# Patient Record
Sex: Female | Born: 1985
Health system: Southern US, Community
[De-identification: ages and names within clinical notes are randomized; demographics above are authoritative.]

## PROBLEM LIST (undated history)

## (undated) DIAGNOSIS — K649 Unspecified hemorrhoids: Secondary | ICD-10-CM

## (undated) DIAGNOSIS — Z5189 Encounter for other specified aftercare: Secondary | ICD-10-CM

---

## 2018-01-02 ENCOUNTER — Encounter (HOSPITAL_COMMUNITY): Payer: Self-pay

## 2018-01-02 ENCOUNTER — Other Ambulatory Visit (HOSPITAL_COMMUNITY): Payer: Self-pay | Admitting: Obstetrics and Gynecology

## 2018-01-02 DIAGNOSIS — Z3A27 27 weeks gestation of pregnancy: Secondary | ICD-10-CM

## 2018-01-02 DIAGNOSIS — O4403 Placenta previa specified as without hemorrhage, third trimester: Secondary | ICD-10-CM

## 2018-01-02 DIAGNOSIS — Z3689 Encounter for other specified antenatal screening: Secondary | ICD-10-CM

## 2018-01-06 ENCOUNTER — Ambulatory Visit (HOSPITAL_COMMUNITY)
Admission: RE | Admit: 2018-01-06 | Discharge: 2018-01-06 | Disposition: A | Discharge status: Home / Self Care | Payer: BLUE CROSS/BLUE SHIELD | Source: Ambulatory Visit | Attending: Obstetrics and Gynecology | Admitting: Obstetrics and Gynecology

## 2018-01-06 ENCOUNTER — Encounter (HOSPITAL_COMMUNITY): Payer: Self-pay

## 2018-01-06 ENCOUNTER — Other Ambulatory Visit (HOSPITAL_COMMUNITY): Payer: Self-pay | Admitting: Obstetrics and Gynecology

## 2018-01-06 DIAGNOSIS — Z3689 Encounter for other specified antenatal screening: Secondary | ICD-10-CM | POA: Insufficient documentation

## 2018-01-06 DIAGNOSIS — Z363 Encounter for antenatal screening for malformations: Secondary | ICD-10-CM

## 2018-01-06 DIAGNOSIS — Z3A27 27 weeks gestation of pregnancy: Secondary | ICD-10-CM | POA: Diagnosis not present

## 2018-01-06 DIAGNOSIS — O4402 Placenta previa specified as without hemorrhage, second trimester: Secondary | ICD-10-CM | POA: Diagnosis not present

## 2018-01-06 DIAGNOSIS — O34219 Maternal care for unspecified type scar from previous cesarean delivery: Secondary | ICD-10-CM

## 2018-01-06 DIAGNOSIS — O4403 Placenta previa specified as without hemorrhage, third trimester: Secondary | ICD-10-CM

## 2018-01-06 DIAGNOSIS — O43212 Placenta accreta, second trimester: Secondary | ICD-10-CM | POA: Diagnosis not present

## 2018-01-06 HISTORY — DX: Unspecified hemorrhoids: K64.9

## 2018-01-06 HISTORY — DX: Encounter for other specified aftercare: Z51.89

## 2018-01-06 NOTE — Consult Note (Signed)
MATERNAL FETAL MEDICINE CONSULT  Patient Name: Melissa Frazier Medical Record Number:  [id number] Date of Birth: Feb 20, 1986 Requesting Physician Name:  Olivia Mackie, MD Date of Service: 01/06/2018  Chief Complaint Suspected placenta accreta  History of Present Illness Melissa Frazier a 32 y.o. [id number],HQ [redacted]w[redacted]d with an EDD of 04/04/2018 who was sent for a consult by Olivia Mackie, MD due to a concern for a placenta accreta.  On a recent ultrasound in his office Melissa Frazier was noted to have a complete placenta previa.  In addition, she has a history of 3 prior low-transverse cesarean deliveries.  She has had no other complications thus far and is otherwise healthy.  She reports mild spotting starting last night, but no frank bleeding, loss of fluid, or contractions.    Review of Systems Pertinent items are noted in HPI.  Patient History OB History  Gravida Para Term Preterm AB Living  5 3 3   1 3   SAB TAB Ectopic Multiple Live Births    1          # Outcome Date GA Lbr Len/2nd Weight Sex Delivery Anes PTL Lv  5 Current           4 Term 2016     CS-Unspec        Complications: Placenta Previa,Other Excessive Bleeding  3 Term 2013     CS-Unspec     2 Term 2005     CS-Unspec     1 TAB 2004              Past Medical History:  Diagnosis Date  . Blood transfusion without reported diagnosis   . Hemorrhoids     Past Surgical History:  Procedure Laterality Date  . CESAREAN SECTION      Social History   Socioeconomic History  . Marital status: Single    Spouse name: Not on file  . Number of children: Not on file  . Years of education: Not on file  . Highest education level: Not on file  Social Needs  . Financial resource strain: Not on file  . Food insecurity - worry: Not on file  . Food insecurity - inability: Not on file  . Transportation needs - medical: Not on file  . Transportation needs - non-medical: Not on file  Occupational History  . Not on file   Tobacco Use  . Smoking status: Former Games developer  . Smokeless tobacco: Never Used  Substance and Sexual Activity  . Alcohol use: No    Frequency: Never  . Drug use: No  . Sexual activity: Not on file  Other Topics Concern  . Not on file  Social History Narrative  . Not on file    Family History  Problem Relation Age of Onset  . Anemia Mother   . Diabetes Father   . Hypertension Maternal Grandmother    In addition, the patient has no family history of mental retardation, birth defects, or genetic diseases.  Physical Examination Vitals - BP 110/53, Pulse 83, Weight 159 lbs General appearance - alert, well appearing, and in no distress Mental status - alert, oriented to person, place, and time Abdomen - soft, nontender, nondistended, no masses or organomegaly Extremities - no pedal edema noted  Assessment and Recommendations 1.  Placenta accreta.  Melissa Frazier has a complete placenta previa and 3 prior cesarean sections.  Thus, her a priori risk of invasive placentation is high.  Today's ultrasound is consistent with this.  There are placental  lakes, increase vascularity between the uterus and the bladder, and there is little discernable uterus between the placenta and the bladder.  However, there is no clear evidence of bladder invasion.  Thus, Melissa Frazier appear to have either a placenta accreta or increta, but not a percreta.  We discussed the risk of life-threatening bleeding with invasive placentation and the different management options up to and including hysterectomy.  Melissa Frazier does not wish to have any more children so a concerted effort for uterine conservation is not warranted.  Thus, I recommend we proceed with a planned cesarean hysterectomy.  As she has not had bleeding planning on a delivery at 34 weeks would be reasonable, but it could be done as early as 32 weeks.  Melissa Frazier will also need to [place] [facility].  I will review Melissa Frazier's case at our next MFM division meeting and make arrangements for Melissa Frazier to transfer care and have a consult with GYN Oncology.   I spent 30 minutes with Melissa Frazier today of which 50% was face-to-face counseling.  [facility].  Please do not hesitate to contact us with questions.   Rema FendtNITSCHE,Josey Forcier, MD

## 2018-01-07 ENCOUNTER — Encounter (HOSPITAL_COMMUNITY): Payer: Self-pay

## 2018-01-14 ENCOUNTER — Encounter: Payer: BLUE CROSS/BLUE SHIELD | Attending: Obstetrics and Gynecology | Admitting: Registered"

## 2018-01-14 DIAGNOSIS — Z3A00 Weeks of gestation of pregnancy not specified: Secondary | ICD-10-CM | POA: Diagnosis not present

## 2018-01-14 DIAGNOSIS — Z713 Dietary counseling and surveillance: Secondary | ICD-10-CM | POA: Diagnosis not present

## 2018-01-14 DIAGNOSIS — R7309 Other abnormal glucose: Secondary | ICD-10-CM

## 2018-01-14 DIAGNOSIS — O99810 Abnormal glucose complicating pregnancy: Secondary | ICD-10-CM | POA: Diagnosis not present

## 2018-01-20 ENCOUNTER — Encounter: Payer: Self-pay | Admitting: Registered"

## 2018-01-20 DIAGNOSIS — R7309 Other abnormal glucose: Secondary | ICD-10-CM | POA: Insufficient documentation

## 2018-01-20 NOTE — Progress Notes (Signed)
Patient was seen on 01/14/2018 for Gestational Diabetes self-management class at the [facility]. The following learning objectives were met by the patient during this course:   States the definition of Gestational Diabetes  States why dietary management is important in controlling blood glucose  Describes the effects each nutrient has on blood glucose levels  Demonstrates ability to create a balanced meal plan  Demonstrates carbohydrate counting   States when to check blood glucose levels  Demonstrates proper blood glucose monitoring techniques  States the effect of stress and exercise on blood glucose levels  States the importance of limiting caffeine and abstaining from alcohol and smoking  Blood glucose monitor given: [employer] Lot # [id number] X Exp: 12/26/18 Blood glucose reading: 86  Patient instructed to monitor glucose levels: FBS: 60 - <95; 1 hour: <140; 2 hour: <120  Patient received handouts:  Nutrition Diabetes and Pregnancy, including carb counting list  Patient will be seen for follow-up as needed.

## 2018-03-30 ENCOUNTER — Inpatient Hospital Stay (HOSPITAL_COMMUNITY): Admission: AD | Admit: 2018-03-30 | Payer: Self-pay | Source: Ambulatory Visit | Admitting: Obstetrics and Gynecology

## 2018-06-27 ENCOUNTER — Encounter (HOSPITAL_COMMUNITY): Payer: Self-pay

## 2020-08-11 ENCOUNTER — Ambulatory Visit: Payer: BLUE CROSS/BLUE SHIELD | Admitting: Family Medicine

## 2021-04-13 ENCOUNTER — Ambulatory Visit (HOSPITAL_COMMUNITY): Payer: Self-pay

## 2021-07-02 ENCOUNTER — Other Ambulatory Visit: Payer: Self-pay

## 2021-07-02 ENCOUNTER — Encounter (HOSPITAL_COMMUNITY): Payer: Self-pay

## 2021-07-02 ENCOUNTER — Emergency Department (HOSPITAL_COMMUNITY): Payer: BC Managed Care – PPO

## 2021-07-02 ENCOUNTER — Emergency Department (HOSPITAL_COMMUNITY)
Admission: EM | Admit: 2021-07-02 | Discharge: 2021-07-02 | Disposition: A | Discharge status: Home / Self Care | Payer: BC Managed Care – PPO | Attending: Emergency Medicine | Admitting: Emergency Medicine

## 2021-07-02 DIAGNOSIS — Z87891 Personal history of nicotine dependence: Secondary | ICD-10-CM | POA: Insufficient documentation

## 2021-07-02 DIAGNOSIS — R002 Palpitations: Secondary | ICD-10-CM | POA: Insufficient documentation

## 2021-07-02 DIAGNOSIS — Z20822 Contact with and (suspected) exposure to covid-19: Secondary | ICD-10-CM | POA: Insufficient documentation

## 2021-07-02 DIAGNOSIS — R0600 Dyspnea, unspecified: Secondary | ICD-10-CM | POA: Diagnosis not present

## 2021-07-02 DIAGNOSIS — R0789 Other chest pain: Secondary | ICD-10-CM | POA: Diagnosis not present

## 2021-07-02 LAB — BASIC METABOLIC PANEL
Anion gap: 7 (ref 5–15)
BUN: 11 mg/dL (ref 6–20)
CO2: 24 mmol/L (ref 22–32)
Calcium: 8.9 mg/dL (ref 8.9–10.3)
Chloride: 106 mmol/L (ref 98–111)
Creatinine, Ser: 0.58 mg/dL (ref 0.44–1.00)
GFR, Estimated: >60 mL/min (ref >60)
Glucose, Bld: 109 mg/dL — ABNORMAL HIGH (ref 70–99)
Potassium: 3.6 mmol/L (ref 3.5–5.1)
Sodium: 137 mmol/L (ref 135–145)

## 2021-07-02 LAB — CBC WITH DIFFERENTIAL/PLATELET
Abs Immature Granulocytes: 0.03 10*3/uL (ref 0.00–0.07)
Basophils Absolute: 0 10*3/uL (ref 0.0–0.1)
Basophils Relative: 0 %
Eosinophils Absolute: 0.1 10*3/uL (ref 0.0–0.5)
Eosinophils Relative: 2 %
HCT: 41.2 % (ref 36.0–46.0)
Hemoglobin: 14.4 g/dL (ref 12.0–15.0)
Immature Granulocytes: 0 %
Lymphocytes Relative: 35 %
Lymphs Abs: 2.7 10*3/uL (ref 0.7–4.0)
MCH: 31.4 pg (ref 26.0–34.0)
MCHC: 35 g/dL (ref 30.0–36.0)
MCV: 90 fL (ref 80.0–100.0)
Monocytes Absolute: 0.6 10*3/uL (ref 0.1–1.0)
Monocytes Relative: 8 %
Neutro Abs: 4.1 10*3/uL (ref 1.7–7.7)
Neutrophils Relative %: 55 %
Platelets: 265 10*3/uL (ref 150–400)
RBC: 4.58 MIL/uL (ref 3.87–5.11)
RDW: 11.9 % (ref 11.5–15.5)
WBC: 7.6 10*3/uL (ref 4.0–10.5)
nRBC: 0 % (ref 0.0–0.2)

## 2021-07-02 LAB — URINALYSIS, ROUTINE W REFLEX MICROSCOPIC
Bilirubin Urine: NEGATIVE
Glucose, UA: NEGATIVE mg/dL
Ketones, ur: 5 mg/dL — AB
Leukocytes,Ua: NEGATIVE
Nitrite: NEGATIVE
Protein, ur: NEGATIVE mg/dL
Specific Gravity, Urine: 1.008 (ref 1.005–1.030)
pH: 6 (ref 5.0–8.0)

## 2021-07-02 LAB — RESP PANEL BY RT-PCR (FLU A&B, COVID) ARPGX2
Influenza A by PCR: NEGATIVE
Influenza B by PCR: NEGATIVE
SARS Coronavirus 2 by RT PCR: NEGATIVE

## 2021-07-02 LAB — I-STAT BETA HCG BLOOD, ED (MC, WL, AP ONLY): I-stat hCG, quantitative: <5 m[IU]/mL (ref <5)

## 2021-07-02 LAB — TROPONIN I (HIGH SENSITIVITY)
Troponin I (High Sensitivity): 3 ng/L (ref <18)
Troponin I (High Sensitivity): <2 ng/L (ref <18)

## 2021-07-02 LAB — CK: Total CK: 66 U/L (ref 38–234)

## 2021-07-02 NOTE — ED Provider Notes (Signed)
[facility] EMERGENCY DEPARTMENT Provider Note   CSN: [id number] Arrival date & time: 07/02/21  1735     History Chief Complaint  Patient presents with   Shortness of Breath    Melissa Frazier is a 35 y.o. female.   Shortness of Breath Associated symptoms: no fever     Pt started having trouble with tightness in chest and shortness of breath the last several days.  Sx are constant.  Nothing seems to trigger it in particular.  She has noticed some palpitations as well.  No fever.  No leg swelling.  Past Medical History:  Diagnosis Date   Blood transfusion without reported diagnosis    Hemorrhoids   Diabetes  Patient Active Problem List   Diagnosis Date Noted   Impaired glucose tolerance test 01/20/2018    Past Surgical History:  Procedure Laterality Date   CESAREAN SECTION       OB History     Gravida  5   Para  3   Term  3   Preterm      AB  1   Living  3      SAB      IAB  1   Ectopic      Multiple      Live Births              Family History  Problem Relation Age of Onset   Anemia Mother    Diabetes Father    Hypertension Maternal Grandmother     Social History   Tobacco Use   Smoking status: Former   Smokeless tobacco: Never  Substance Use Topics   Alcohol use: No   Drug use: No    Home Medications Prior to Admission medications   Medication Sig Start Date End Date Taking? Authorizing Provider  Prenatal Vit-Fe Fumarate-FA (PRENATAL VITAMIN PO) Take by mouth.    [provider]  Metformmin  Allergies    Patient has no known allergies.  Review of Systems   Review of Systems  Constitutional:  Negative for fever.  Respiratory:  Positive for shortness of breath.   Neurological:  Negative for weakness.  All other systems reviewed and are negative.  Physical Exam Updated Vital Signs BP 110/68   Pulse 63   Temp 98.7 F (37.1 C) (Oral)   Resp (!) 21   SpO2 99%   Physical Exam Vitals  and nursing note reviewed.  Constitutional:      General: She is not in acute distress.    Appearance: She is well-developed.  HENT:     Head: Normocephalic and atraumatic.     Right Ear: External ear normal.     Left Ear: External ear normal.  Eyes:     General: No scleral icterus.       Right eye: No discharge.        Left eye: No discharge.     Conjunctiva/sclera: Conjunctivae normal.  Neck:     Trachea: No tracheal deviation.  Cardiovascular:     Rate and Rhythm: Normal rate and regular rhythm.  Pulmonary:     Effort: Pulmonary effort is normal. No respiratory distress.     Breath sounds: Normal breath sounds. No stridor. No wheezing or rales.  Abdominal:     General: Bowel sounds are normal. There is no distension.     Palpations: Abdomen is soft.     Tenderness: There is no abdominal tenderness. There is no guarding or rebound.  Musculoskeletal:  General: No tenderness or deformity.     Cervical back: Neck supple.  Skin:    General: Skin is warm and dry.     Findings: No rash.  Neurological:     General: No focal deficit present.     Mental Status: She is alert.     Cranial Nerves: No cranial nerve deficit (no facial droop, extraocular movements intact, no slurred speech).     Sensory: No sensory deficit.     Motor: No abnormal muscle tone or seizure activity.     Coordination: Coordination normal.  Psychiatric:        Mood and Affect: Mood normal.    ED Results / Procedures / Treatments   Labs (all labs ordered are listed, but only abnormal results are displayed) Labs Reviewed  BASIC METABOLIC PANEL - Abnormal; Notable for the following components:      Result Value   Glucose, Bld 109 (*)    All other components within normal limits  URINALYSIS, ROUTINE W REFLEX MICROSCOPIC - Abnormal; Notable for the following components:   Color, Urine STRAW (*)    Hgb urine dipstick SMALL (*)    Ketones, ur 5 (*)    Bacteria, UA RARE (*)    All other components  within normal limits  RESP PANEL BY RT-PCR (FLU A&B, COVID) [id number]  CBC WITH DIFFERENTIAL/PLATELET  CK  I-STAT BETA HCG BLOOD, ED ([facility], [facility], [facility] ONLY)  TROPONIN I (HIGH SENSITIVITY)  TROPONIN I (HIGH SENSITIVITY)    EKG EKG Interpretation  Date/Time:  Monday July 02 2021 17:44:44 EDT Ventricular Rate:  68 PR Interval:  166 QRS Duration: 80 QT Interval:  384 QTC Calculation: 408 R Axis:   88 Text Interpretation: Normal sinus rhythm Normal ECG No old tracing to compare Confirmed by Linwood Dibbles [phone]) on 07/02/2021 8:03:56 PM  Radiology DG Chest 2 View  Result Date: 07/02/2021 CLINICAL DATA:  Chest tightness and shortness of breath. EXAM: CHEST - 2 VIEW COMPARISON:  None. FINDINGS: The heart size and mediastinal contours are within normal limits. Both lungs are clear. The visualized skeletal structures are unremarkable. IMPRESSION: No active cardiopulmonary disease. Electronically Signed   By: Darliss Cheney M.D.   On: 07/02/2021 19:23    Procedures Procedures   Medications Ordered in ED Medications - No data to display  ED Course  I have reviewed the triage vital signs and the nursing notes.  Pertinent labs & imaging results that were available during my care of the patient were reviewed by me and considered in my medical decision making (see chart for details).  Clinical Course as of 07/02/21 2231  Mon Jul 02, 2021  2223 Labs reviewed.  UA negative.  Metabolic panel normal.  CK normal. [JK]  2223 COVID and flu negative.  Serial troponins are normal. [JK]    Clinical Course User Index [JK] Linwood Dibbles, MD   MDM Rules/Calculators/A&P                           Patient presented with palpitations and shortness of breath.  ED work-up does not show any signs of pulmonary edema.  No signs of pneumothorax or pneumonia.  Patient is low risk for PE and PERC negative.  COVID and flu are negative.  Patient also complained of palpitations but no dysrhythmia noted in the ED and her EKG  is normal.  Patient appears comfortable and is breathing easily.  At this time no signs of any  serious etiology.  Appears stable for discharge.  Outpatient follow-up with PCP for symptoms persist Final Clinical Impression(s) / ED Diagnoses Final diagnoses:  Palpitations  Dyspnea, unspecified type    Rx / DC Orders ED Discharge Orders     None        Linwood Dibbles, MD 07/02/21 2231

## 2021-07-02 NOTE — ED Provider Notes (Signed)
Emergency Medicine Provider Triage Evaluation Note  Melissa Frazier , a 35 y.o. female  was evaluated in triage.  Pt complains of palpations.  She reports that over the past two days she has been feeling some chest tightness, shortness of breath, and body wide weakness.  No focal weakness.  Poor appetite.    She does note that over the past week she has been working out.  She states that she did not do much over the summer however recently has gotten back into heavy workouts. She feels dizziness when she stands.  No hormone use, no leg swelling, no personal history of DVT/PE.  Review of Systems  Positive: Body wide weakness, chest tightness, shortness of breath Negative: Sick contacts, body pain, fevers  Physical Exam  BP 128/79 (BP Location: Left Arm)   Pulse 71   Temp 98.7 F (37.1 C) (Oral)   Resp 17   SpO2 100%  Gen:   Awake, no distress   Resp:  Normal effort  MSK:   Moves extremities without difficulty  Other:  Speech is not slurred.   Medical Decision Making  Medically screening exam initiated at 6:13 PM.  Appropriate orders placed.  Delane Stalling was informed that the remainder of the evaluation will be completed by another provider, this initial triage assessment does not replace that evaluation, and the importance of remaining in the ED until their evaluation is complete.  Will check CK given body wide weakness and recently starting workouts again.  Additionally given her shortness of breath will obtain chest x-ray, EKG, basic screening labs.  As she has type 2 diabetes we will also obtain a troponin despite her young age.    Cristina Gong, PA-C 07/02/21 1819    Linwood Dibbles, MD 07/03/21 2123

## 2021-07-02 NOTE — ED Triage Notes (Signed)
Pt c/o chest tightness, SHOB, weakness, poor PO intake x2 days. States day before yesterday, experienced palpitations. Denies sick contact, vaccinated but unboosted for covid.

## 2021-07-02 NOTE — Discharge Instructions (Addendum)
Your evaluation in the ED today was reassuring.  Lab tests and x-rays were normal.  Follow-up with your doctor to be rechecked if the symptoms do not resolve over the next few days

## 2021-07-02 NOTE — ED Notes (Signed)
Patient care taken, patient is having chest pressure that started 3 days ago, originally with fluttering in chest. Now just feels heavy

## 2021-07-02 NOTE — ED Notes (Signed)
Pt is in X-RAY °

## 2022-01-14 IMAGING — CR DG CHEST 2V
2 series · 2 of 2 positions shown · non-contrast
Comparison: None.

CLINICAL DATA: Chest tightness and shortness of breath.

EXAM:
CHEST - 2 VIEW

[chest pa]
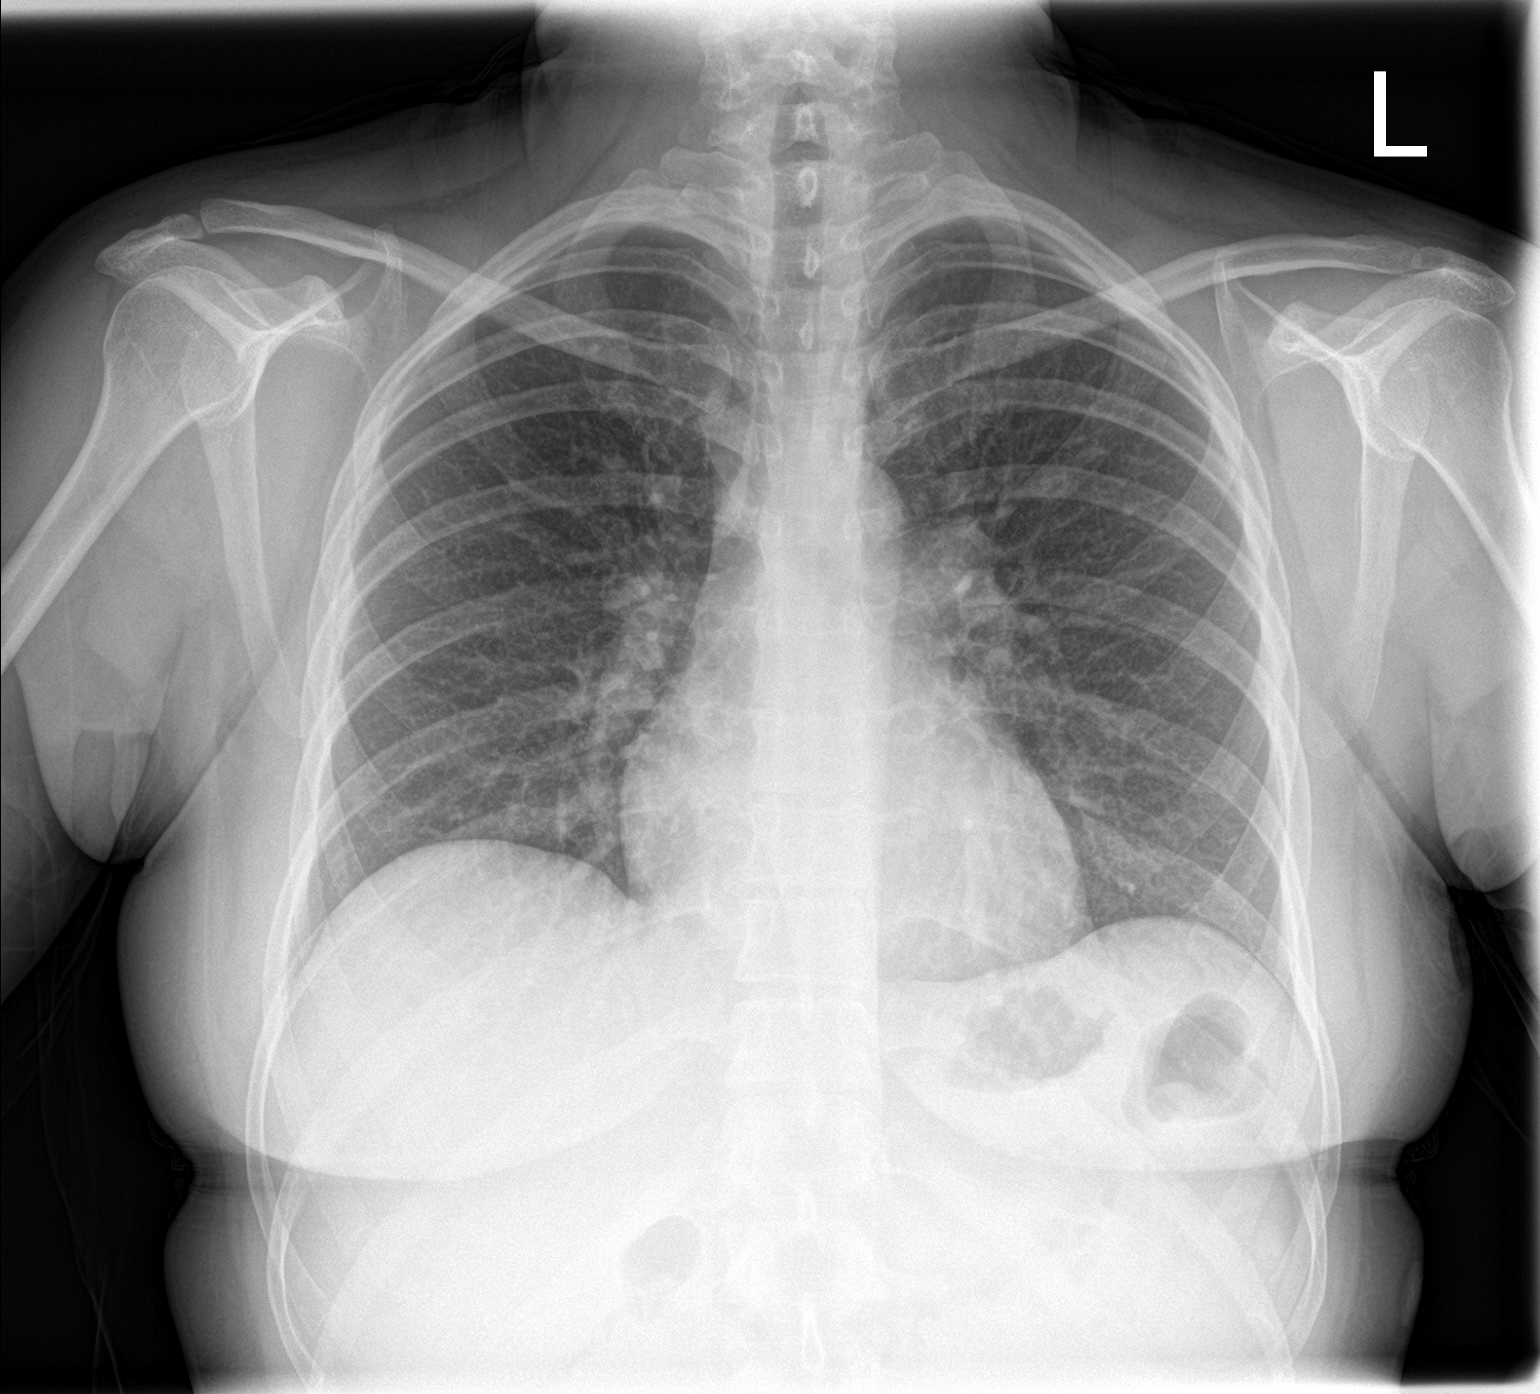

[chest lat]
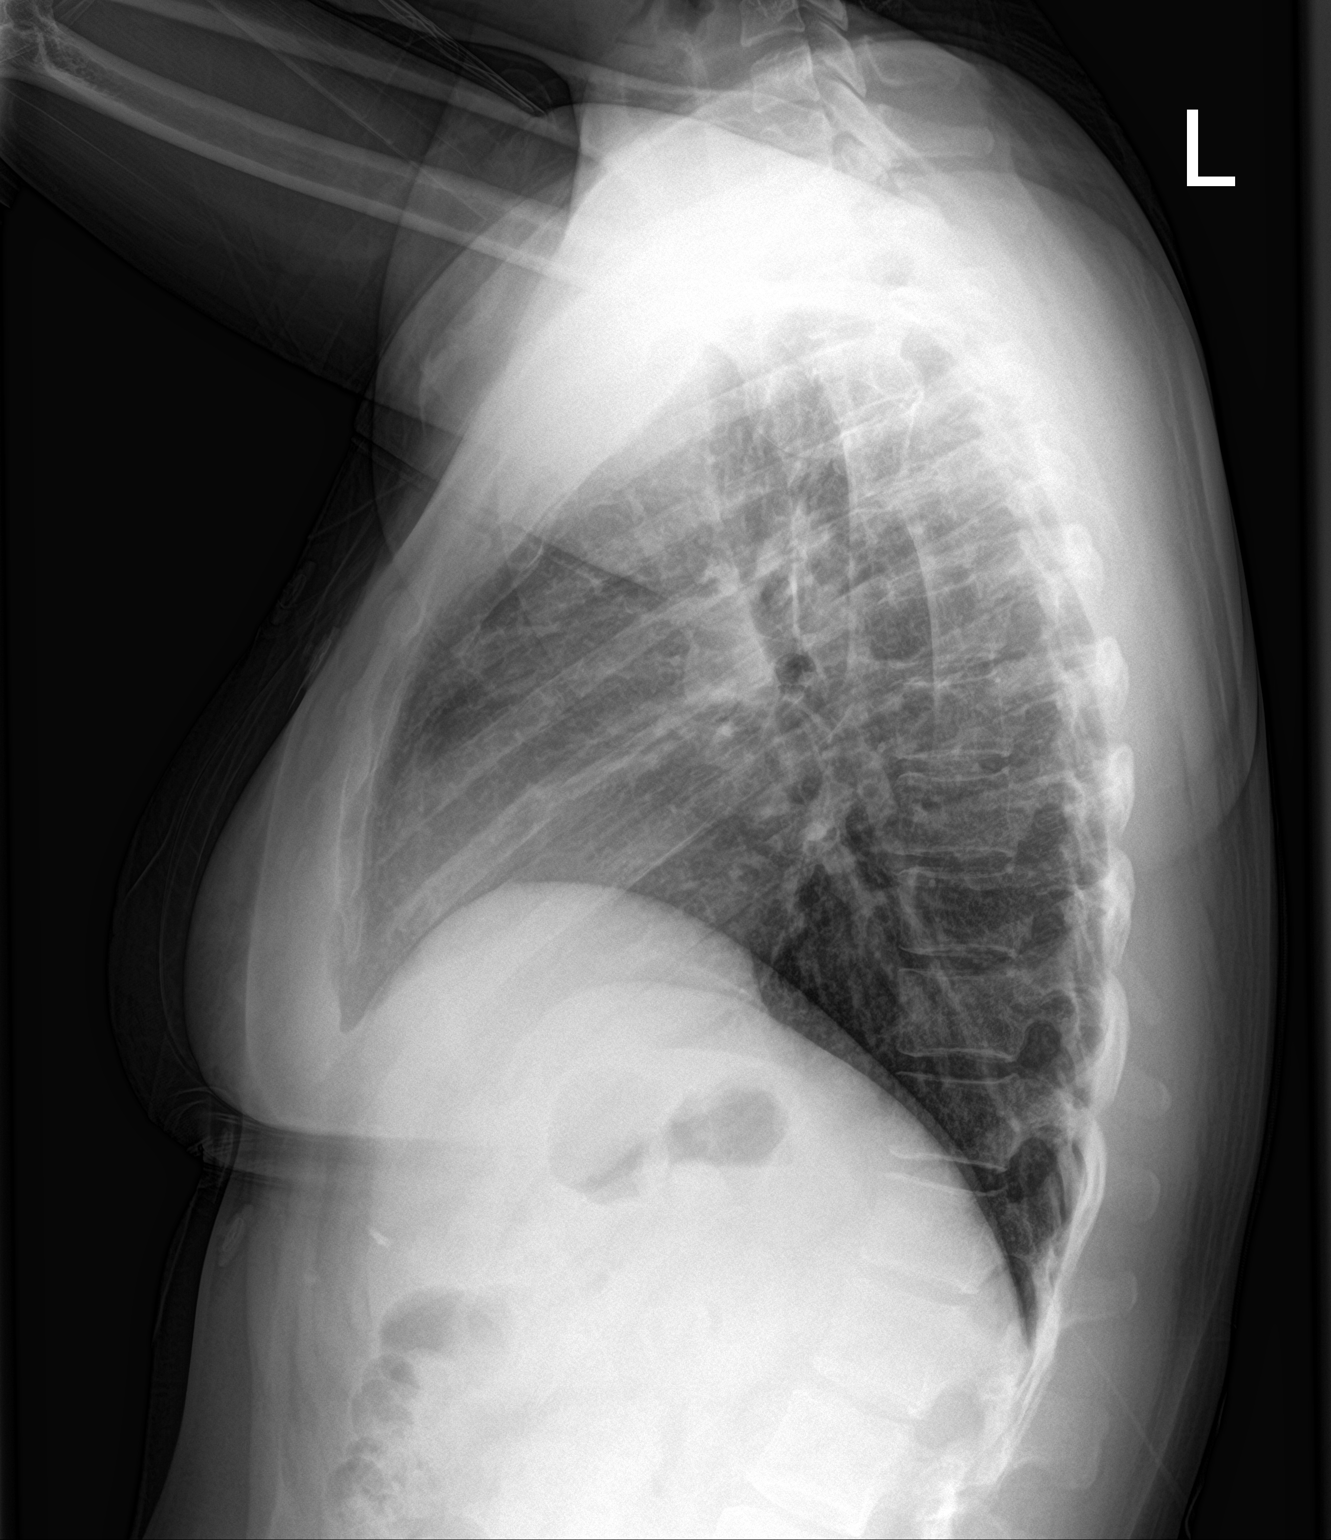

[2 of 2 positions shown; findings below may reference images not displayed]

FINDINGS: The heart size and mediastinal contours are within normal limits.
Both lungs are clear. The visualized skeletal structures are
unremarkable.
IMPRESSION: No active cardiopulmonary disease.

## 2023-12-07 ENCOUNTER — Emergency Department (HOSPITAL_COMMUNITY)
Admission: EM | Admit: 2023-12-07 | Discharge: 2023-12-08 | Disposition: A | Discharge status: Home / Self Care | Payer: 59 | Attending: Emergency Medicine | Admitting: Emergency Medicine

## 2023-12-07 ENCOUNTER — Other Ambulatory Visit: Payer: Self-pay

## 2023-12-07 DIAGNOSIS — N132 Hydronephrosis with renal and ureteral calculous obstruction: Secondary | ICD-10-CM | POA: Diagnosis not present

## 2023-12-07 DIAGNOSIS — N200 Calculus of kidney: Secondary | ICD-10-CM

## 2023-12-07 DIAGNOSIS — R1032 Left lower quadrant pain: Secondary | ICD-10-CM | POA: Diagnosis present

## 2023-12-07 DIAGNOSIS — R7309 Other abnormal glucose: Secondary | ICD-10-CM | POA: Diagnosis not present

## 2023-12-07 LAB — CBC
HCT: 40.3 % (ref 36.0–46.0)
Hemoglobin: 13.8 g/dL (ref 12.0–15.0)
MCH: 31.4 pg (ref 26.0–34.0)
MCHC: 34.2 g/dL (ref 30.0–36.0)
MCV: 91.8 fL (ref 80.0–100.0)
Platelets: 289 10*3/uL (ref 150–400)
RBC: 4.39 MIL/uL (ref 3.87–5.11)
RDW: 11.7 % (ref 11.5–15.5)
WBC: 7.9 10*3/uL (ref 4.0–10.5)
nRBC: 0 % (ref 0.0–0.2)

## 2023-12-07 LAB — URINALYSIS, ROUTINE W REFLEX MICROSCOPIC
Bilirubin Urine: NEGATIVE
Glucose, UA: NEGATIVE mg/dL
Ketones, ur: NEGATIVE mg/dL
Leukocytes,Ua: NEGATIVE
Nitrite: NEGATIVE
Protein, ur: NEGATIVE mg/dL
RBC / HPF: >50 RBC/hpf (ref 0–5)
Specific Gravity, Urine: 1.021 (ref 1.005–1.030)
pH: 6 (ref 5.0–8.0)

## 2023-12-07 LAB — BASIC METABOLIC PANEL
Anion gap: 9 (ref 5–15)
BUN: 17 mg/dL (ref 6–20)
CO2: 25 mmol/L (ref 22–32)
Calcium: 9.1 mg/dL (ref 8.9–10.3)
Chloride: 103 mmol/L (ref 98–111)
Creatinine, Ser: 0.97 mg/dL (ref 0.44–1.00)
GFR, Estimated: >60 mL/min (ref >60)
Glucose, Bld: 126 mg/dL — ABNORMAL HIGH (ref 70–99)
Potassium: 3.8 mmol/L (ref 3.5–5.1)
Sodium: 137 mmol/L (ref 135–145)

## 2023-12-07 LAB — CBG MONITORING, ED: Glucose-Capillary: 116 mg/dL — ABNORMAL HIGH (ref 70–99)

## 2023-12-07 NOTE — ED Triage Notes (Signed)
 Pt to ED c/o left flank pain , pain with urination, blood in urine x 4 days. Hx DM

## 2023-12-08 ENCOUNTER — Emergency Department (HOSPITAL_COMMUNITY): Payer: 59

## 2023-12-08 MED ORDER — ONDANSETRON 4 MG PO TBDP: 4.0000 mg | ORAL_TABLET | Freq: Three times a day (TID) | ORAL | 0 refills | Status: AC | PRN

## 2023-12-08 MED ORDER — HYDROMORPHONE HCL 1 MG/ML IJ SOLN
0.5000 mg | Freq: Once | INTRAMUSCULAR | Status: AC
  Administered 2023-12-08: 0.5 mg via INTRAVENOUS
  Filled 2023-12-08: qty 1

## 2023-12-08 MED ORDER — ONDANSETRON HCL 4 MG/2ML IJ SOLN
4.0000 mg | Freq: Once | INTRAMUSCULAR | Status: AC
  Administered 2023-12-08: 4 mg via INTRAVENOUS
  Filled 2023-12-08: qty 2

## 2023-12-08 MED ORDER — TAMSULOSIN HCL 0.4 MG PO CAPS: 0.4000 mg | ORAL_CAPSULE | Freq: Two times a day (BID) | ORAL | 0 refills | Status: AC

## 2023-12-08 MED ORDER — OXYCODONE-ACETAMINOPHEN 5-325 MG PO TABS: 1.0000 | ORAL_TABLET | Freq: Four times a day (QID) | ORAL | 0 refills | Status: AC | PRN

## 2023-12-08 MED ORDER — KETOROLAC TROMETHAMINE 30 MG/ML IJ SOLN
30.0000 mg | Freq: Once | INTRAMUSCULAR | Status: AC
  Administered 2023-12-08: 30 mg via INTRAVENOUS
  Filled 2023-12-08: qty 1

## 2023-12-08 NOTE — ED Provider Notes (Signed)
[facility]-EMERGENCY DEPT [facility] Emergency Department Provider Note MRN:  [id number]  Arrival date & time: 12/08/23     Chief Complaint   Flank Pain and Dysuria   History of Present Illness   Melissa Frazier is a 38 y.o. year-old female presents to the ED with chief complaint of left-sided flank pain for the past 2 to 3 days.  She states that radiates into her left lower abdomen.  She states that she has had some associated dysuria.  She has history of hysterectomy.  She rates the pain as moderate to severe.  Denies any successful treatments prior to arrival.  She denies fevers, but states she had some chills in the lobby.  She has had some associated nausea and vomiting.  History provided by patient.   Review of Systems  Pertinent positive and negative review of systems noted in HPI.    Physical Exam   Vitals:   12/08/23 0315 12/08/23 0555  BP: 120/69 122/80  Pulse: 79 67  Resp: 16 17  Temp: 98.2 F (36.8 C) 99 F (37.2 C)  SpO2: 99% 98%    CONSTITUTIONAL:  uncomfortable-appearing, NAD NEURO:  Alert and oriented x 3, CN 3-12 grossly intact EYES:  eyes equal and reactive ENT/NECK:  Supple, no stridor  CARDIO:  normal rate, regular rhythm, appears well-perfused  PULM:  No respiratory distress, CTAB GI/GU:  non-distended, no focal abdominal tenderness MSK/SPINE:  No gross deformities, no edema, moves all extremities  SKIN:  no rash, atraumatic   *Additional and/or pertinent findings included in MDM below  Diagnostic and Interventional Summary    EKG Interpretation Date/Time:    Ventricular Rate:    PR Interval:    QRS Duration:    QT Interval:    QTC Calculation:   R Axis:      Text Interpretation:         Labs Reviewed  URINALYSIS, ROUTINE W REFLEX MICROSCOPIC - Abnormal; Notable for the following components:      Result Value   APPearance HAZY (*)    Hgb urine dipstick LARGE (*)    Bacteria, UA RARE (*)    All other components within normal  limits  BASIC METABOLIC PANEL - Abnormal; Notable for the following components:   Glucose, Bld 126 (*)    All other components within normal limits  CBG MONITORING, ED - Abnormal; Notable for the following components:   Glucose-Capillary 116 (*)    All other components within normal limits  CBC    CT Renal Stone Study  Final Result      Medications  ketorolac (TORADOL) 30 MG/ML injection 30 mg (30 mg Intravenous Given 12/08/23 0554)  HYDROmorphone (DILAUDID) injection 0.5 mg (0.5 mg Intravenous Given 12/08/23 0554)  ondansetron (ZOFRAN) injection 4 mg (4 mg Intravenous Given 12/08/23 0554)     Procedures  /  Critical Care Procedures  ED Course and Medical Decision Making  I have reviewed the triage vital signs, the nursing notes, and pertinent available records from the EMR.  Social Determinants Affecting Complexity of Care: Patient has no clinically significant social determinants affecting this chief complaint..   ED Course:    Medical Decision Making Patient here with left-sided flank and abdominal pain.  Worrisome for kidney stone.  Labs ordered in triage are thus far reassuring.  No leukocytosis or significant anemia.  No significant electrolyte derangement.  Normal creatinine.  Urinalysis has greater than 50 RBCs.  6:24 AM Patient reassessed.  Feels much better after medication in  ED. recommend outpatient urology follow-up.  Patient started on Flomax.  Will Rx Zofran and Percocet for pain.  Return precautions discussed.  Patient understands and agrees with the plan.  Amount and/or Complexity of Data Reviewed Labs: ordered. Radiology: independent interpretation performed.    Details: Kidney stone present  Risk Prescription drug management.         Consultants: No consultations were needed in caring for this patient.   Treatment and Plan: I considered admission due to patient's initial presentation, but after considering the examination and diagnostic  results, patient will not require admission and can be discharged with outpatient follow-up.    Final Clinical Impressions(s) / ED Diagnoses     ICD-10-CM   1. Kidney stone  N20.0       ED Discharge Orders          Ordered    oxyCODONE-acetaminophen (PERCOCET) 5-325 MG tablet  Every 6 hours PRN        12/08/23 0622    ondansetron (ZOFRAN-ODT) 4 MG disintegrating tablet  Every 8 hours PRN        12/08/23 0622    tamsulosin (FLOMAX) 0.4 MG CAPS capsule  2 times daily        12/08/23 0622              Discharge Instructions Discussed with and Provided to Patient:   Discharge Instructions   None      Roxy Horseman, Cordelia Poche 12/08/23 4098    Tilden Fossa, MD 12/09/23 2326
